# Patient Record
Sex: Male | Born: 1995 | Hispanic: Yes | Marital: Married | Smoking: Never smoker
Health system: Southern US, Community
[De-identification: ages and names within clinical notes are randomized; demographics above are authoritative.]

---
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
## 2023-01-14 ENCOUNTER — Ambulatory Visit: Payer: Self-pay | Admitting: *Deleted

## 2023-01-14 NOTE — Telephone Encounter (Signed)
Summary: Symptoms, seeking new patient appt.   Pt is concerned because he has had body aches and a headache unlike anything he has before. Some Vomiting last night as well.  Best contact: [phone]  ----- Message from Franchot Heidelberg sent at 01/14/2023  1:38 PM EDT ----- Pt is concerned because he has had body aches and a headache unlike anything he has before. Some Vomiting last night as well.  Best contact: [phone]        Called patient at # listed for call back #[phone] via interpreter Jari Favre ID # [phone] to review sx body ache, headache and vomiting. No answer , [facility] #[phone].

## 2023-01-14 NOTE — Telephone Encounter (Signed)
Using [employer] [phone].  Chief Complaint: Headache Symptoms: vomited yesterday morning, fatigue, no headache at present time (went away yesterday morning after taking ibuprofen) Frequency: Headache onset 01/12/23 around 2230 Pertinent Negatives: Patient denies symptoms at present time Disposition: [] ED /[] Urgent Care (no appt availability in office) / [] Appointment(In office/virtual)/ []  [facility]/ [x] Home Care/ [] Refused Recommended Disposition /[] [facility]/ []  Follow-up with PCP Additional Notes: Patient denies any symptoms at this time other than fatigue. He says all symptoms went away yesterday morning after taking ibuprofen. He says he would like to schedule with a doctor so that he can get checked out if he needs to go. New Patient appointment scheduled.    Summary: Symptoms, seeking new patient appt.   Pt is concerned because he has had body aches and a headache unlike anything he has before. Some Vomiting last night as well.  Best contact: [phone]      Reason for Disposition  [1] Headache AND [2] has not taken pain medications  Answer Assessment - Initial Assessment Questions 1. LOCATION: "Where does it hurt?"      Left eye 2. ONSET: "When did the headache start?" (Minutes, hours or days)      Last night around 2230 3. PATTERN: "Does the pain come and go, or has it been constant since it started?"     Constant until yesterday when it stopped 4. SEVERITY: "How bad is the pain?" and "What does it keep you from doing?"  (e.g., Scale 1-10; mild, moderate, or severe)   - MILD (1-3): doesn't interfere with normal activities    - MODERATE (4-7): interferes with normal activities or awakens from sleep    - SEVERE (8-10): excruciating pain, unable to do any normal activities        No pain at present; yesterday pain 8/10 5. RECURRENT SYMPTOM: "Have you ever had headaches before?" If Yes, ask: "When was the last time?" and "What  happened that time?"      Never had one like this 6. CAUSE: "What do you think is causing the headache?"     Unsure 7. MIGRAINE: "Have you been diagnosed with migraine headaches?" If Yes, ask: "Is this headache similar?"      No 8. HEAD INJURY: "Has there been any recent injury to the head?"      No 9. OTHER SYMPTOMS: "Do you have any other symptoms?" (fever, stiff neck, eye pain, sore throat, cold symptoms)     Vomited yesterday morning  Protocols used: Headache-A-AH

## 2023-02-26 ENCOUNTER — Ambulatory Visit (INDEPENDENT_AMBULATORY_CARE_PROVIDER_SITE_OTHER): Payer: BC Managed Care – PPO | Admitting: Internal Medicine

## 2023-02-26 ENCOUNTER — Encounter: Payer: Self-pay | Admitting: Internal Medicine

## 2023-02-26 VITALS — BP 118/78 | HR 62 | Temp 98.0°F | Ht 67.0 in | Wt 186.7 lb

## 2023-02-26 DIAGNOSIS — Z114 Encounter for screening for human immunodeficiency virus [HIV]: Secondary | ICD-10-CM

## 2023-02-26 DIAGNOSIS — Z0000 Encounter for general adult medical examination without abnormal findings: Secondary | ICD-10-CM | POA: Diagnosis not present

## 2023-02-26 DIAGNOSIS — Z23 Encounter for immunization: Secondary | ICD-10-CM

## 2023-02-26 DIAGNOSIS — Z0001 Encounter for general adult medical examination with abnormal findings: Secondary | ICD-10-CM

## 2023-02-26 DIAGNOSIS — Z1159 Encounter for screening for other viral diseases: Secondary | ICD-10-CM | POA: Diagnosis not present

## 2023-02-26 DIAGNOSIS — Z13220 Encounter for screening for lipoid disorders: Secondary | ICD-10-CM

## 2023-02-26 NOTE — Progress Notes (Signed)
New Patient Office Visit  Subjective    Patient ID: Omar Ellis, male    DOB: 06/14/1996  Age: 27 y.o. MRN: [id number]  CC:  Chief Complaint  Patient presents with   Establish Care    No specialist    HPI Omar Ellis presents to establish care. He has no chronic medical conditions and takes no regular medications. He has only been hospitalized for sports injuries and surgery after he broke his fibula when he was 27 years old. He very rarely will have severe headaches. It has only happened twice but the last was a few weeks ago. States he ate some ramen, then a few hours later had pain behind his right eye that radiated into his neck. He threw up twice and took an Advil and the pain went away. This happened once before about 5 years ago.  Health Maintenance: -Blood work due -Flu vaccine due   No outpatient encounter medications on file as of 02/26/2023.   No facility-administered encounter medications on file as of 02/26/2023.    History reviewed. No pertinent past medical history.  History reviewed. No pertinent surgical history.  Family History  Problem Relation Age of Onset   Diabetes Mother    Schizophrenia Brother    Cancer Maternal Grandfather     Social History   Socioeconomic History   Marital status: Married    Spouse name: Not on file   Number of children: Not on file   Years of education: Not on file   Highest education level: Not on file  Occupational History   Not on file  Tobacco Use   Smoking status: Never   Smokeless tobacco: Never  Vaping Use   Vaping status: Never Used  Substance and Sexual Activity   Alcohol use: Never   Drug use: Never   Sexual activity: Yes  Other Topics Concern   Not on file  Social History Narrative   Not on file   Social Determinants of Health   Financial Resource Strain: Not on file  Food Insecurity: Not on file  Transportation Needs: Not on file  Physical Activity: Not on file  Stress: Not on file   Social Connections: Not on file  Intimate Partner Violence: Not on file    Review of Systems  Constitutional:  Negative for chills and fever.  Eyes:  Negative for blurred vision.  Respiratory:  Negative for shortness of breath.   Cardiovascular:  Negative for chest pain.  Neurological:  Positive for headaches.        Objective    BP 118/78   Pulse 62   Temp 98 F (36.7 C)   Ht 5\' 7"  (1.702 m)   Wt 186 lb 11.2 oz (84.7 kg)   SpO2 98%   BMI 29.24 kg/m   Physical Exam Constitutional:      Appearance: Normal appearance.  HENT:     Head: Normocephalic and atraumatic.     Mouth/Throat:     Mouth: Mucous membranes are moist.     Pharynx: Oropharynx is clear.  Eyes:     Extraocular Movements: Extraocular movements intact.     Conjunctiva/sclera: Conjunctivae normal.     Pupils: Pupils are equal, round, and reactive to light.  Neck:     Comments: No thyromegaly Cardiovascular:     Rate and Rhythm: Normal rate and regular rhythm.  Pulmonary:     Effort: Pulmonary effort is normal.     Breath sounds: Normal breath sounds.  Musculoskeletal:  Cervical back: No tenderness.     Right lower leg: No edema.     Left lower leg: No edema.  Lymphadenopathy:     Cervical: No cervical adenopathy.  Skin:    General: Skin is warm and dry.  Neurological:     General: No focal deficit present.     Mental Status: He is alert. Mental status is at baseline.  Psychiatric:        Mood and Affect: Mood normal.        Behavior: Behavior normal.         Assessment & Plan:   1. Annual physical exam/Lipid screening/Encounter for hepatitis C screening test for low risk patient/Screening for HIV without presence of risk factors: Annual physical complete, labs done.  - HIV antibody (with reflex) - Hepatitis C Antibody - CBC w/Diff/Platelet - COMPLETE METABOLIC PANEL WITH GFR - Lipid Profile  2. Need for influenza vaccination: Flu vaccine administered today.   - Flu vaccine  trivalent PF, 6mos and older(Flulaval,Afluria,Fluarix,Fluzone)   Return in about 1 year (around 02/26/2024).   Margarita Mail, DO

## 2023-02-27 LAB — CBC WITH DIFFERENTIAL/PLATELET
Absolute Monocytes: 437 {cells}/uL (ref 200–950)
Basophils Absolute: 18 {cells}/uL (ref 0–200)
Basophils Relative: 0.3 %
Eosinophils Absolute: 89 {cells}/uL (ref 15–500)
Eosinophils Relative: 1.5 %
HCT: 43.7 % (ref 38.5–50.0)
Hemoglobin: 14.9 g/dL (ref 13.2–17.1)
Lymphs Abs: 2395 {cells}/uL (ref 850–3900)
MCH: 29.2 pg (ref 27.0–33.0)
MCHC: 34.1 g/dL (ref 32.0–36.0)
MCV: 85.7 fL (ref 80.0–100.0)
MPV: 9.8 fL (ref 7.5–12.5)
Monocytes Relative: 7.4 %
Neutro Abs: 2962 cells/uL (ref 1500–7800)
Neutrophils Relative %: 50.2 %
Platelets: 284 10*3/uL (ref 140–400)
RBC: 5.1 10*6/uL (ref 4.20–5.80)
RDW: 12.1 % (ref 11.0–15.0)
Total Lymphocyte: 40.6 %
WBC: 5.9 10*3/uL (ref 3.8–10.8)

## 2023-02-27 LAB — LIPID PANEL
Cholesterol: 221 mg/dL — ABNORMAL HIGH (ref <200)
HDL: 44 mg/dL (ref >=40)
LDL Cholesterol (Calc): 145 mg/dL — ABNORMAL HIGH
Non-HDL Cholesterol (Calc): 177 mg/dL — ABNORMAL HIGH (ref <130)
Total CHOL/HDL Ratio: 5 (calc) — ABNORMAL HIGH (ref <5.0)
Triglycerides: 181 mg/dL — ABNORMAL HIGH (ref <150)

## 2023-02-27 LAB — COMPLETE METABOLIC PANEL WITH GFR
AG Ratio: 1.4 (calc) (ref 1.0–2.5)
ALT: 18 U/L (ref 9–46)
AST: 16 U/L (ref 10–40)
Albumin: 4.3 g/dL (ref 3.6–5.1)
Alkaline phosphatase (APISO): 108 U/L (ref 36–130)
BUN: 16 mg/dL (ref 7–25)
CO2: 28 mmol/L (ref 20–32)
Calcium: 9.3 mg/dL (ref 8.6–10.3)
Chloride: 103 mmol/L (ref 98–110)
Creat: 0.8 mg/dL (ref 0.60–1.24)
Globulin: 3.1 g/dL (ref 1.9–3.7)
Glucose, Bld: 88 mg/dL (ref 65–99)
Potassium: 4.3 mmol/L (ref 3.5–5.3)
Sodium: 140 mmol/L (ref 135–146)
Total Bilirubin: 0.5 mg/dL (ref 0.2–1.2)
Total Protein: 7.4 g/dL (ref 6.1–8.1)
eGFR: 126 mL/min/{1.73_m2} (ref >=60)

## 2023-02-27 LAB — HEPATITIS C ANTIBODY: Hepatitis C Ab: NONREACTIVE

## 2023-02-27 LAB — HIV ANTIBODY (ROUTINE TESTING W REFLEX): HIV 1&2 Ab, 4th Generation: NONREACTIVE

## 2024-03-02 ENCOUNTER — Ambulatory Visit: Payer: Self-pay | Admitting: Internal Medicine

## 2024-06-30 ENCOUNTER — Ambulatory Visit
Admission: EM | Admit: 2024-06-30 | Discharge: 2024-06-30 | Disposition: A | Discharge status: Home / Self Care | Payer: Self-pay | Attending: Emergency Medicine | Admitting: Emergency Medicine

## 2024-06-30 ENCOUNTER — Encounter: Payer: Self-pay | Admitting: Emergency Medicine

## 2024-06-30 DIAGNOSIS — K4090 Unilateral inguinal hernia, without obstruction or gangrene, not specified as recurrent: Secondary | ICD-10-CM

## 2024-06-30 DIAGNOSIS — R1032 Left lower quadrant pain: Secondary | ICD-10-CM

## 2024-06-30 MED ORDER — DOXYCYCLINE HYCLATE 100 MG PO CAPS: 100.0000 mg | ORAL_CAPSULE | Freq: Two times a day (BID) | ORAL | 0 refills | Status: AC

## 2024-06-30 NOTE — ED Triage Notes (Signed)
 Patient reports left groin pain and swelling x 1 week. Patient took Ibuprofen for pain with moderate relief. Rates pain 2/10.

## 2024-06-30 NOTE — Discharge Instructions (Addendum)
 Today you were evaluated for your left groin pain  Today we are starting antibiotics as there is small Maryan Sivak bumps, redness and tenderness at the area of concern  Take doxycycline twice daily for 7 days for treatment of bacteria  Based on placement and description of symptoms it is also possible that your symptoms are being caused by a hernia which is a small tear in the skin allowing tissue to push through, while uncomfortable this is not typically harmful but if called knee pain can be fixed, hernias need to be confirmed by imaging therefore if you have not seen any improvement in your symptoms by Monday please reach out to your primary doctor for follow-up  You may continue to take ibuprofen as needed  You may hold warm compresses to the groin

## 2024-06-30 NOTE — ED Provider Notes (Signed)
 " Omar Ellis    CSN: [id number] Arrival date & time: 06/30/24  1619      History   Chief Complaint Chief Complaint  Patient presents with   Groin Pain   Groin Swelling    HPI Omar Ellis is a 29 y.o. male.   Patient presents for evaluation of left-sided groin pain present for 7 days.  Has noticed a slight bulging and symptoms are exacerbated by lifting.  Has attempted use of ibuprofen which has been helpful.  Denies injury or trauma.  Has shaving the area since symptoms began.   History reviewed. No pertinent past medical history.  There are no active problems to display for this patient.   History reviewed. No pertinent surgical history.     Home Medications    Prior to Admission medications  Not on File    Family History Family History  Problem Relation Age of Onset   Diabetes Mother    Schizophrenia Brother    Cancer Maternal Grandfather     Social History Social History[1]   Allergies   Patient has no known allergies.   Review of Systems Review of Systems   Physical Exam Triage Vital Signs ED Triage Vitals  Encounter Vitals Group     BP 06/30/24 1809 115/60     Girls Systolic BP Percentile --      Girls Diastolic BP Percentile --      Boys Systolic BP Percentile --      Boys Diastolic BP Percentile --      Pulse Rate 06/30/24 1809 60     Resp 06/30/24 1809 18     Temp 06/30/24 1809 98 F (36.7 C)     Temp Source 06/30/24 1809 Oral     SpO2 06/30/24 1809 98 %     Weight --      Height --      Head Circumference --      Peak Flow --      Pain Score 06/30/24 1810 2     Pain Loc --      Pain Education --      Exclude from Growth Chart --    No data found.  Updated Vital Signs BP 115/60 (BP Location: Right Arm)   Pulse 60   Temp 98 F (36.7 C) (Oral)   Resp 18   SpO2 98%   Visual Acuity Right Eye Distance:   Left Eye Distance:   Bilateral Distance:    Right Eye Near:   Left Eye Near:    Bilateral Near:      Physical Exam Constitutional:      Appearance: Normal appearance.  Eyes:     Extraocular Movements: Extraocular movements intact.  Pulmonary:     Effort: Pulmonary effort is normal.  Musculoskeletal:     Comments: Tenderness present to the center of the left groin, no bulging noted, no ecchymosis or deformity noted, 2+ femoral pulse  Skin:    Comments: Scant pustular lesions over the left aspect of the pelvis with mild erythema and tenderness  Neurological:     Mental Status: He is alert and oriented to person, place, and time. Mental status is at baseline.      UC Treatments / Results  Labs (all labs ordered are listed, but only abnormal results are displayed) Labs Reviewed - No data to display  EKG   Radiology No results found.  Procedures Procedures (including critical care time)  Medications Ordered in UC Medications -  No data to display  Initial Impression / Assessment and Plan / UC Course  I have reviewed the triage vital signs and the nursing notes.  Pertinent labs & imaging results that were available during my care of the patient were reviewed by me and considered in my medical decision making (see chart for details).  Left groin pain  Presentation concerning for mild hernia versus skin infection, discussed this with patient, scant pustular lesions noted to the area concerning for infection, not consistent with herpetic lesions, empirically placed on doxycycline  and recommended supportive care through warm compresses and over-the-counter analgesics, if no improvement seen within 72 hours advised follow-up with primary doctor for further evaluation for hernia  Final Clinical Impressions(s) / UC Diagnoses   Final diagnoses:  None   Discharge Instructions   None    ED Prescriptions   None    PDMP not reviewed this encounter.     [1]  Social History Tobacco Use   Smoking status: Never   Smokeless tobacco: Never  Vaping Use   Vaping status:  Never Used  Substance Use Topics   Alcohol use: Never   Drug use: Never     Teresa Shelba SAUNDERS, NP 07/01/24 [phone]  "
